# Patient Record
Sex: Male | Born: 1965
Health system: Southern US, Community
[De-identification: ages and names within clinical notes are randomized; demographics above are authoritative.]

---

## 2009-05-03 ENCOUNTER — Encounter: Admission: RE | Admit: 2009-05-03 | Discharge: 2009-05-03 | Payer: Self-pay | Admitting: Family Medicine

## 2010-11-24 ENCOUNTER — Encounter: Payer: Self-pay | Admitting: Family Medicine

## 2017-05-07 DIAGNOSIS — Z23 Encounter for immunization: Secondary | ICD-10-CM | POA: Diagnosis not present

## 2017-05-07 DIAGNOSIS — Z0000 Encounter for general adult medical examination without abnormal findings: Secondary | ICD-10-CM | POA: Diagnosis not present

## 2017-10-12 ENCOUNTER — Encounter (HOSPITAL_COMMUNITY): Payer: Self-pay | Admitting: Emergency Medicine

## 2017-10-12 ENCOUNTER — Ambulatory Visit (HOSPITAL_COMMUNITY)
Admission: EM | Admit: 2017-10-12 | Discharge: 2017-10-12 | Disposition: A | Discharge status: Home / Self Care | Payer: 59 | Attending: Family Medicine | Admitting: Family Medicine

## 2017-10-12 DIAGNOSIS — R040 Epistaxis: Secondary | ICD-10-CM

## 2017-10-12 MED ORDER — MUPIROCIN 2 % EX OINT: 1.0000 "application " | TOPICAL_OINTMENT | Freq: Three times a day (TID) | CUTANEOUS | 1 refills | Status: AC

## 2017-10-12 NOTE — Discharge Instructions (Signed)
Some oxymetazoline and use twice a day for the next 2 days on both sides.  About 20 or 30 minutes after using the oxymetazoline (Afrin), apply a small amount of the bacitracin ointment.  If you develop any further bleeding, simply apply firm pressure to the anterior part of the nose where it soft and continue the pressure for about 10-15 minutes, followed by Afrin nasal spray

## 2017-10-12 NOTE — ED Triage Notes (Signed)
PT C/O: nose bleeds... sts he has been using Flonase x4-5 months... Sts bleeding has lasted up to 1 hour.   ONSET: x2 days  SX ALSO INCLUDE:   DENIES: inj/trauma  TAKING MEDS: none  A&O x4... NAD... Ambulatory

## 2017-10-12 NOTE — ED Provider Notes (Signed)
  [facility]   [id number] 10/12/17 Arrival Time: 1051   SUBJECTIVE:  Prentiss BellsMarwan M Crumrine is a 51 y.o. male who presents to the urgent care with complaint of epistaxis for 2 days.  He has been using Flonase nasal spray for quite some time to prevent his recurrent sinus infections.  Patient states that the bleeding has been intermittent and primarily from the right nostril.     History reviewed. No pertinent past medical history. History reviewed. No pertinent family history. Social History   Socioeconomic History  . Marital status: Married    Spouse name: Not on file  . Number of children: Not on file  . Years of education: Not on file  . Highest education level: Not on file  Social Needs  . Financial resource strain: Not on file  . Food insecurity - worry: Not on file  . Food insecurity - inability: Not on file  . Transportation needs - medical: Not on file  . Transportation needs - non-medical: Not on file  Occupational History  . Not on file  Tobacco Use  . Smoking status: Never Smoker  . Smokeless tobacco: Never Used  Substance and Sexual Activity  . Alcohol use: Not on file  . Drug use: Not on file  . Sexual activity: Not on file  Other Topics Concern  . Not on file  Social History Narrative  . Not on file   Current Meds  Medication Sig  . amLODipine (NORVASC) 5 MG tablet Take 5 mg by mouth daily.   Allergies  Allergen Reactions  . Aspirin       ROS: As per HPI, remainder of ROS negative.   OBJECTIVE:   Vitals:   10/12/17 1103  BP: (!) 143/92  Pulse: 73  Resp: 20  Temp: 97.7 F (36.5 C)  TempSrc: Oral  SpO2: 97%     General appearance: alert; no distress Eyes: PERRL; EOMI; conjunctiva normal HENT: normocephalic; atraumatic; TMs normal, canal normal, external ears normal without trauma; nasal mucosa friable on both sides.; oral mucosa normal Neck: supple Back: no CVA tenderness Extremities: no cyanosis or edema; symmetrical with no  gross deformities Skin: warm and dry Neurologic: normal gait; grossly normal Psychological: alert and cooperative; normal mood and affect   Silver nitrate was applied to the septum on both sides of the nasal septum.   Labs:  No results found for this or any previous visit.  Labs Reviewed - No data to display  No results found.     ASSESSMENT & PLAN:  1. Right-sided epistaxis     Meds ordered this encounter  Medications  . mupirocin ointment (BACTROBAN) 2 %    Sig: Apply 1 application topically 3 (three) times daily.    Dispense:  30 g    Refill:  1    Reviewed expectations re: course of current medical issues. Questions answered. Outlined signs and symptoms indicating need for more acute intervention. Patient verbalized understanding. After Visit Summary given.    Procedures:      Elvina SidleLauenstein, Trevor Wilkie, MD 10/12/17 1224

## 2018-03-16 DIAGNOSIS — M25551 Pain in right hip: Secondary | ICD-10-CM | POA: Diagnosis not present

## 2018-03-16 DIAGNOSIS — I10 Essential (primary) hypertension: Secondary | ICD-10-CM | POA: Diagnosis not present

## 2018-03-16 DIAGNOSIS — H6123 Impacted cerumen, bilateral: Secondary | ICD-10-CM | POA: Diagnosis not present

## 2018-03-16 DIAGNOSIS — M542 Cervicalgia: Secondary | ICD-10-CM | POA: Diagnosis not present

## 2018-03-22 DIAGNOSIS — M5030 Other cervical disc degeneration, unspecified cervical region: Secondary | ICD-10-CM | POA: Diagnosis not present

## 2018-03-22 DIAGNOSIS — M25551 Pain in right hip: Secondary | ICD-10-CM | POA: Diagnosis not present

## 2018-03-22 DIAGNOSIS — M545 Low back pain: Secondary | ICD-10-CM | POA: Diagnosis not present

## 2018-04-12 DIAGNOSIS — Z0184 Encounter for antibody response examination: Secondary | ICD-10-CM | POA: Diagnosis not present

## 2018-04-12 DIAGNOSIS — Z23 Encounter for immunization: Secondary | ICD-10-CM | POA: Diagnosis not present

## 2018-05-07 DIAGNOSIS — I10 Essential (primary) hypertension: Secondary | ICD-10-CM | POA: Diagnosis not present

## 2018-05-07 DIAGNOSIS — Z13220 Encounter for screening for lipoid disorders: Secondary | ICD-10-CM | POA: Diagnosis not present

## 2018-05-07 DIAGNOSIS — Z0000 Encounter for general adult medical examination without abnormal findings: Secondary | ICD-10-CM | POA: Diagnosis not present

## 2018-06-01 DIAGNOSIS — J342 Deviated nasal septum: Secondary | ICD-10-CM | POA: Diagnosis not present

## 2018-06-01 DIAGNOSIS — R51 Headache: Secondary | ICD-10-CM | POA: Diagnosis not present

## 2018-06-01 DIAGNOSIS — J343 Hypertrophy of nasal turbinates: Secondary | ICD-10-CM | POA: Diagnosis not present

## 2018-06-01 DIAGNOSIS — H6123 Impacted cerumen, bilateral: Secondary | ICD-10-CM | POA: Diagnosis not present

## 2018-06-24 DIAGNOSIS — M545 Low back pain: Secondary | ICD-10-CM | POA: Diagnosis not present

## 2018-06-24 DIAGNOSIS — M5030 Other cervical disc degeneration, unspecified cervical region: Secondary | ICD-10-CM | POA: Diagnosis not present

## 2018-06-24 DIAGNOSIS — M542 Cervicalgia: Secondary | ICD-10-CM | POA: Diagnosis not present

## 2018-06-28 ENCOUNTER — Other Ambulatory Visit: Payer: Self-pay | Admitting: Orthopaedic Surgery

## 2018-06-28 DIAGNOSIS — M5030 Other cervical disc degeneration, unspecified cervical region: Secondary | ICD-10-CM

## 2018-06-28 DIAGNOSIS — M545 Low back pain: Secondary | ICD-10-CM

## 2018-07-07 ENCOUNTER — Ambulatory Visit
Admission: RE | Admit: 2018-07-07 | Discharge: 2018-07-07 | Disposition: A | Discharge status: Home / Self Care | Payer: 59 | Source: Ambulatory Visit | Attending: Orthopaedic Surgery | Admitting: Orthopaedic Surgery

## 2018-07-07 DIAGNOSIS — M545 Low back pain: Secondary | ICD-10-CM | POA: Diagnosis not present

## 2018-07-07 DIAGNOSIS — M5030 Other cervical disc degeneration, unspecified cervical region: Secondary | ICD-10-CM

## 2018-07-07 DIAGNOSIS — M4802 Spinal stenosis, cervical region: Secondary | ICD-10-CM | POA: Diagnosis not present

## 2018-07-23 DIAGNOSIS — M545 Low back pain: Secondary | ICD-10-CM | POA: Diagnosis not present

## 2018-07-23 DIAGNOSIS — M7918 Myalgia, other site: Secondary | ICD-10-CM | POA: Diagnosis not present

## 2018-07-23 DIAGNOSIS — M5030 Other cervical disc degeneration, unspecified cervical region: Secondary | ICD-10-CM | POA: Diagnosis not present

## 2018-07-23 DIAGNOSIS — Z6829 Body mass index (BMI) 29.0-29.9, adult: Secondary | ICD-10-CM | POA: Diagnosis not present

## 2018-08-02 DIAGNOSIS — J342 Deviated nasal septum: Secondary | ICD-10-CM | POA: Diagnosis not present

## 2018-08-02 DIAGNOSIS — J329 Chronic sinusitis, unspecified: Secondary | ICD-10-CM | POA: Diagnosis not present

## 2018-08-02 DIAGNOSIS — J3489 Other specified disorders of nose and nasal sinuses: Secondary | ICD-10-CM | POA: Diagnosis not present

## 2018-08-02 DIAGNOSIS — J343 Hypertrophy of nasal turbinates: Secondary | ICD-10-CM | POA: Diagnosis not present

## 2018-08-26 DIAGNOSIS — M542 Cervicalgia: Secondary | ICD-10-CM | POA: Diagnosis not present

## 2018-08-26 DIAGNOSIS — M5030 Other cervical disc degeneration, unspecified cervical region: Secondary | ICD-10-CM | POA: Diagnosis not present

## 2018-08-26 DIAGNOSIS — M545 Low back pain: Secondary | ICD-10-CM | POA: Diagnosis not present

## 2018-09-08 DIAGNOSIS — M5412 Radiculopathy, cervical region: Secondary | ICD-10-CM | POA: Diagnosis not present

## 2018-09-08 DIAGNOSIS — M5030 Other cervical disc degeneration, unspecified cervical region: Secondary | ICD-10-CM | POA: Diagnosis not present

## 2018-09-08 DIAGNOSIS — M542 Cervicalgia: Secondary | ICD-10-CM | POA: Diagnosis not present

## 2018-09-08 DIAGNOSIS — M7918 Myalgia, other site: Secondary | ICD-10-CM | POA: Diagnosis not present

## 2018-10-12 DIAGNOSIS — Z23 Encounter for immunization: Secondary | ICD-10-CM | POA: Diagnosis not present

## 2018-10-21 DIAGNOSIS — M5030 Other cervical disc degeneration, unspecified cervical region: Secondary | ICD-10-CM | POA: Diagnosis not present

## 2018-10-21 DIAGNOSIS — M7918 Myalgia, other site: Secondary | ICD-10-CM | POA: Diagnosis not present

## 2018-10-21 DIAGNOSIS — M5416 Radiculopathy, lumbar region: Secondary | ICD-10-CM | POA: Diagnosis not present

## 2020-03-05 IMAGING — MR MR CERVICAL SPINE W/O CM
4 of 5 series · 27 of 48 positions shown · non-contrast
Comparison: None.

CLINICAL DATA: Neck pain.  No radiculopathy.

EXAM:
MRI CERVICAL SPINE WITHOUT CONTRAST
TECHNIQUE: Multiplanar, multisequence MR imaging of the cervical spine was
performed. No intravenous contrast was administered.

[Series 6: T1 · sagittal · 3.0mm · 0.66mm/px · 7 of 15 slices shown]
[im 1/15]
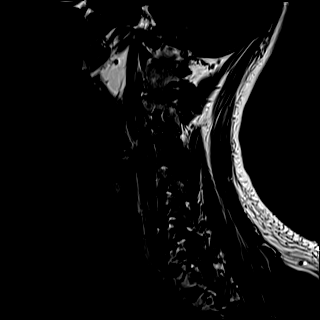
[im 3/15]
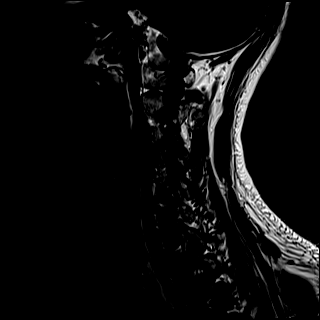
[im 5/15]
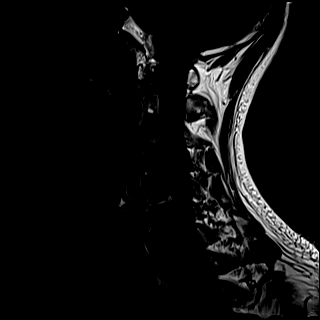
[im 8/15]
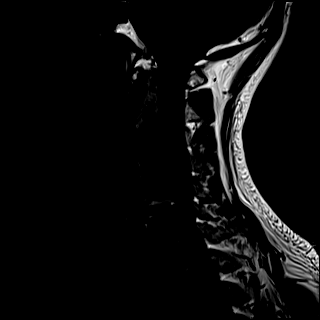
[im 10/15]
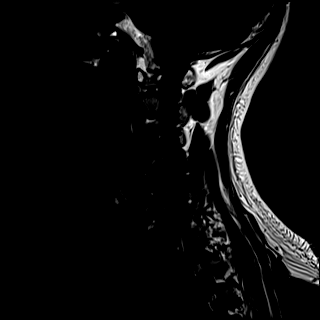
[im 12/15]
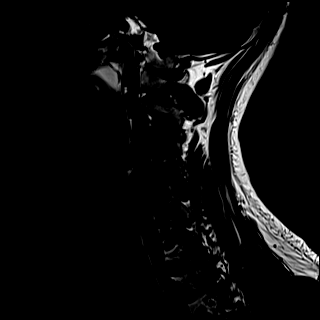
[im 15/15]
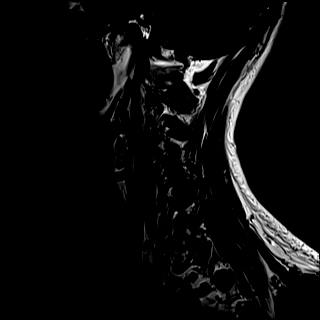

[Series 7: T2 · sagittal · 3.0mm · 0.55mm/px · 7 of 15 slices shown (1 of 2)]
[im 1/15]
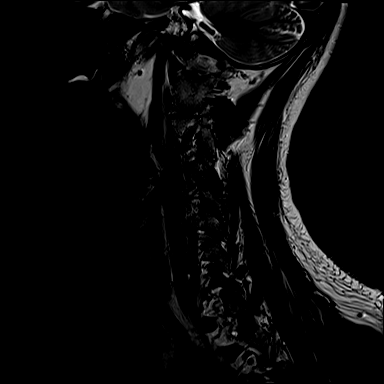
[im 3/15]
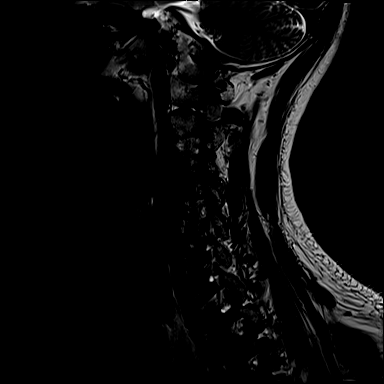
[im 5/15]
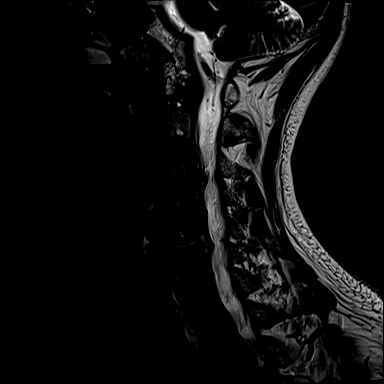
[im 8/15]
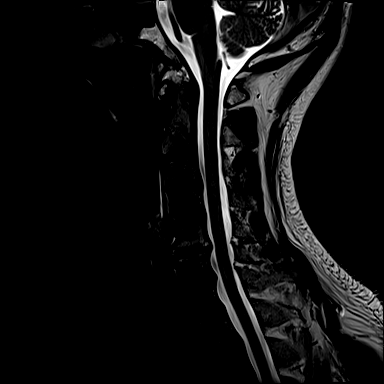
[im 10/15]
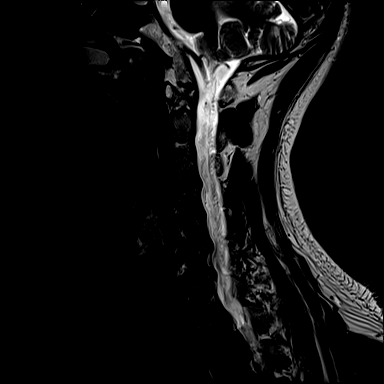
[im 12/15]
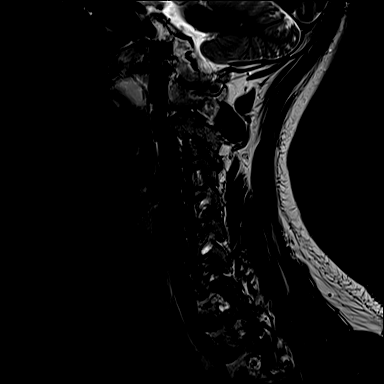
[im 15/15]
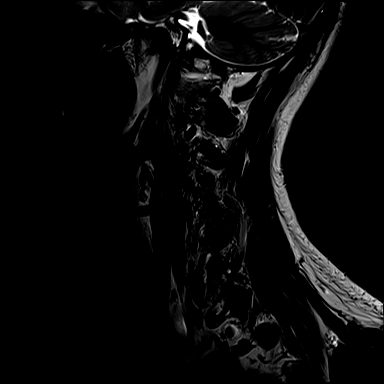

[Series 8: STIR · sagittal · 3.0mm · 0.33mm/px · 5 of 15 slices shown]
[im 1/15]
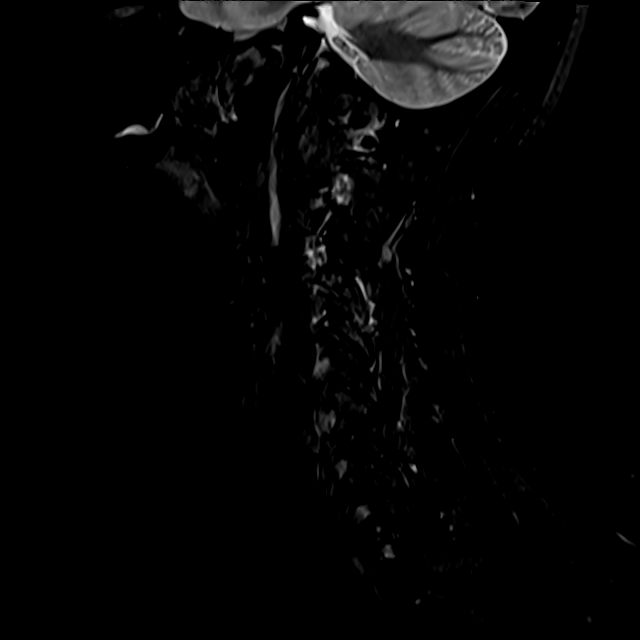
[im 3/15]
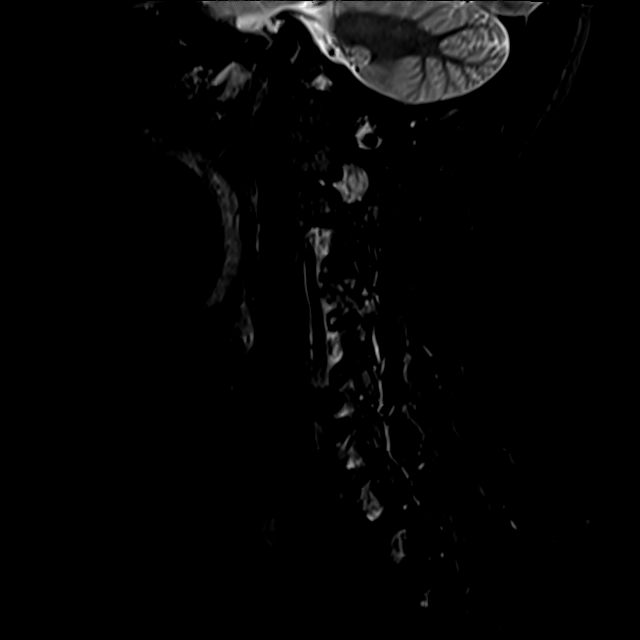
[im 6/15]
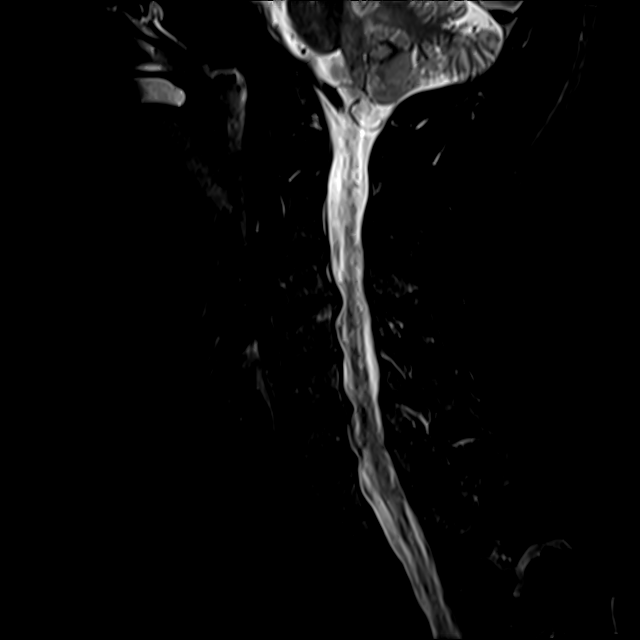
[im 9/15]
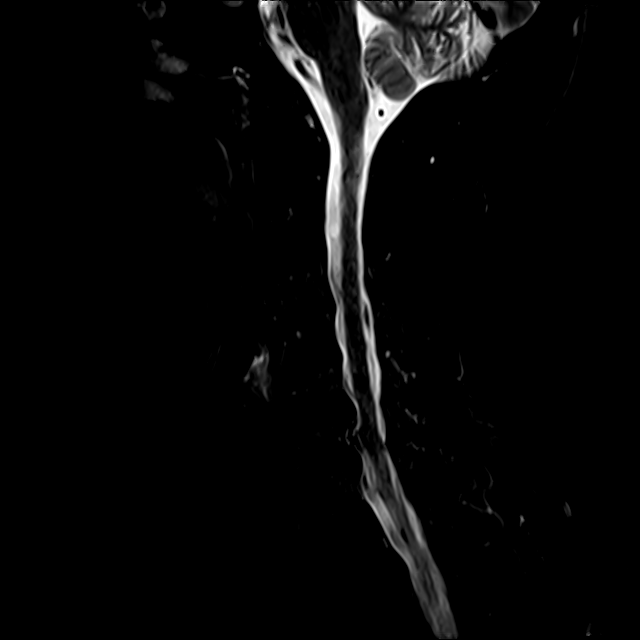
[im 15/15]
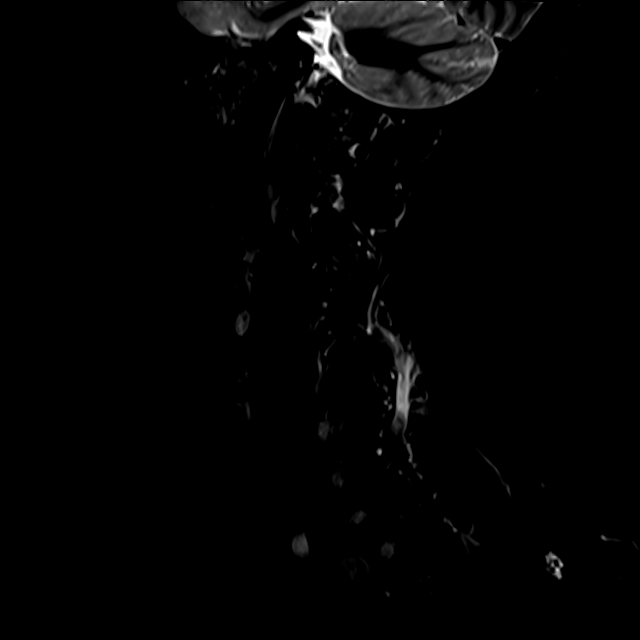

[Series 9: T2 · axial · 3.0mm · 0.50mm/px · z∈[-74,+28]mm · 8 of 33 slices shown (2 of 2)]
[im 1/33]
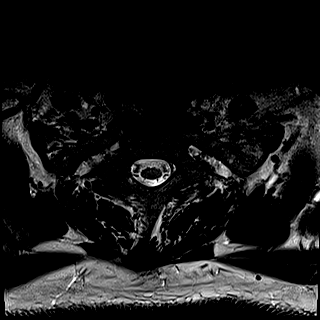
[im 5/33]
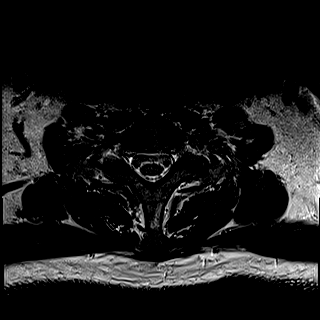
[im 10/33]
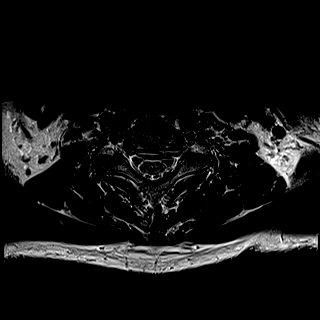
[im 15/33]
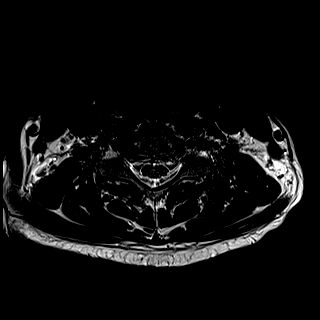
[im 18/33]
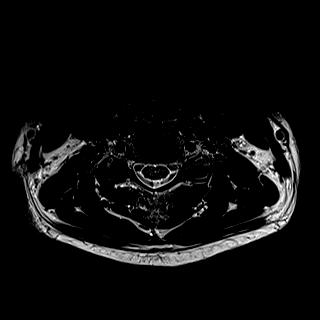
[im 23/33]
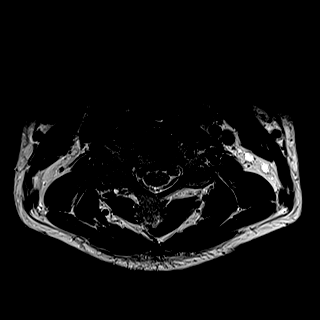
[im 28/33]
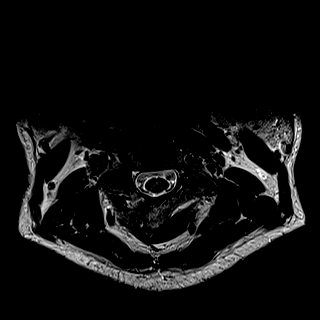
[im 33/33]
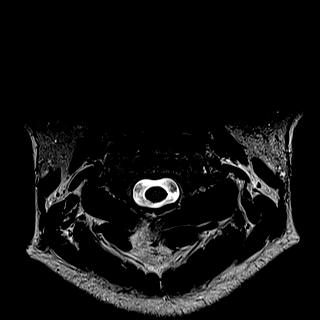

[27 of 48 positions shown; findings below may reference images not displayed]

FINDINGS: Alignment: Physiologic.

Vertebrae: No fracture, evidence of discitis, or bone lesion.

Cord: Normal signal and morphology.

Posterior Fossa, vertebral arteries, paraspinal tissues: Posterior
fossa demonstrates no focal abnormality. Vertebral artery flow voids
are maintained. Paraspinal soft tissues are unremarkable.

Disc levels:

Discs: Degenerative disease with disc height loss at C5-6 and C6-7.

C2-3: No significant disc bulge. Mild left foraminal stenosis. Mild
left facet arthropathy. No central canal stenosis.

C3-4: Broad-based disc bulge with a focal right
paracentral/foraminal disc osteophyte complex. Severe right
foraminal stenosis. Moderate right facet arthropathy. No left
foraminal stenosis. No central canal stenosis.

C4-5: No significant disc bulge. No neural foraminal stenosis. No
central canal stenosis.

C5-6: Broad-based disc osteophyte complex. Moderate right and mild
left foraminal stenosis. No central canal stenosis.

C6-7: Broad-based disc bulge. Left uncovertebral degenerative
changes. Moderate left foraminal stenosis. No right foraminal
stenosis. No central canal stenosis.

C7-T1: No significant disc bulge. No neural foraminal stenosis. No
central canal stenosis.
IMPRESSION: 1. At C3-4 there is a disc bulge with a focal right
paracentral/foraminal disc osteophyte complex. Severe right
foraminal stenosis. Moderate right facet arthropathy. No left
foraminal stenosis.
2. At C5-6 there is a broad-based disc osteophyte complex. Moderate
right and mild left foraminal stenosis.
3. At C6-7 there is a broad-based disc bulge. Left uncovertebral
degenerative changes. Moderate left foraminal stenosis. No right
foraminal stenosis.

## 2024-08-22 ENCOUNTER — Other Ambulatory Visit (HOSPITAL_BASED_OUTPATIENT_CLINIC_OR_DEPARTMENT_OTHER): Payer: Self-pay | Admitting: Family Medicine

## 2024-08-22 DIAGNOSIS — E7800 Pure hypercholesterolemia, unspecified: Secondary | ICD-10-CM

## 2024-09-08 ENCOUNTER — Ambulatory Visit (HOSPITAL_BASED_OUTPATIENT_CLINIC_OR_DEPARTMENT_OTHER)
Admission: RE | Admit: 2024-09-08 | Discharge: 2024-09-08 | Disposition: A | Discharge status: Home / Self Care | Payer: Self-pay | Source: Ambulatory Visit | Attending: Family Medicine | Admitting: Family Medicine

## 2024-09-08 DIAGNOSIS — E7800 Pure hypercholesterolemia, unspecified: Secondary | ICD-10-CM
